# Patient Record
Sex: Female | Born: 1960 | Race: White | Hispanic: No | Smoking: Never smoker
Health system: Southern US, Community
[De-identification: ages and names within clinical notes are randomized; demographics above are authoritative.]

## PROBLEM LIST (undated history)

## (undated) DIAGNOSIS — N819 Female genital prolapse, unspecified: Secondary | ICD-10-CM

## (undated) DIAGNOSIS — F419 Anxiety disorder, unspecified: Secondary | ICD-10-CM

## (undated) HISTORY — PX: OTHER SURGICAL HISTORY: SHX169

## (undated) HISTORY — DX: Anxiety disorder, unspecified: F41.9

## (undated) HISTORY — DX: Female genital prolapse, unspecified: N81.9

---

## 1993-03-22 HISTORY — PX: BREAST LUMPECTOMY: SHX2

## 2014-12-30 HISTORY — PX: HYSTEROSCOPY: SHX211

## 2017-12-14 DIAGNOSIS — G43909 Migraine, unspecified, not intractable, without status migrainosus: Secondary | ICD-10-CM | POA: Insufficient documentation

## 2019-06-19 LAB — HM PAP SMEAR

## 2019-07-10 ENCOUNTER — Ambulatory Visit: Payer: Self-pay

## 2019-07-10 ENCOUNTER — Other Ambulatory Visit: Payer: Self-pay

## 2019-07-10 ENCOUNTER — Other Ambulatory Visit: Payer: Self-pay | Admitting: Family Medicine

## 2019-07-10 DIAGNOSIS — Z0000 Encounter for general adult medical examination without abnormal findings: Secondary | ICD-10-CM

## 2020-06-10 LAB — HM PAP SMEAR

## 2020-11-24 IMAGING — DX DG CHEST 1V
1 series · 1 of 1 positions shown · non-contrast
Comparison: None.

CLINICAL DATA: Physical exam.

EXAM:
CHEST  1 VIEW

[chest pa]
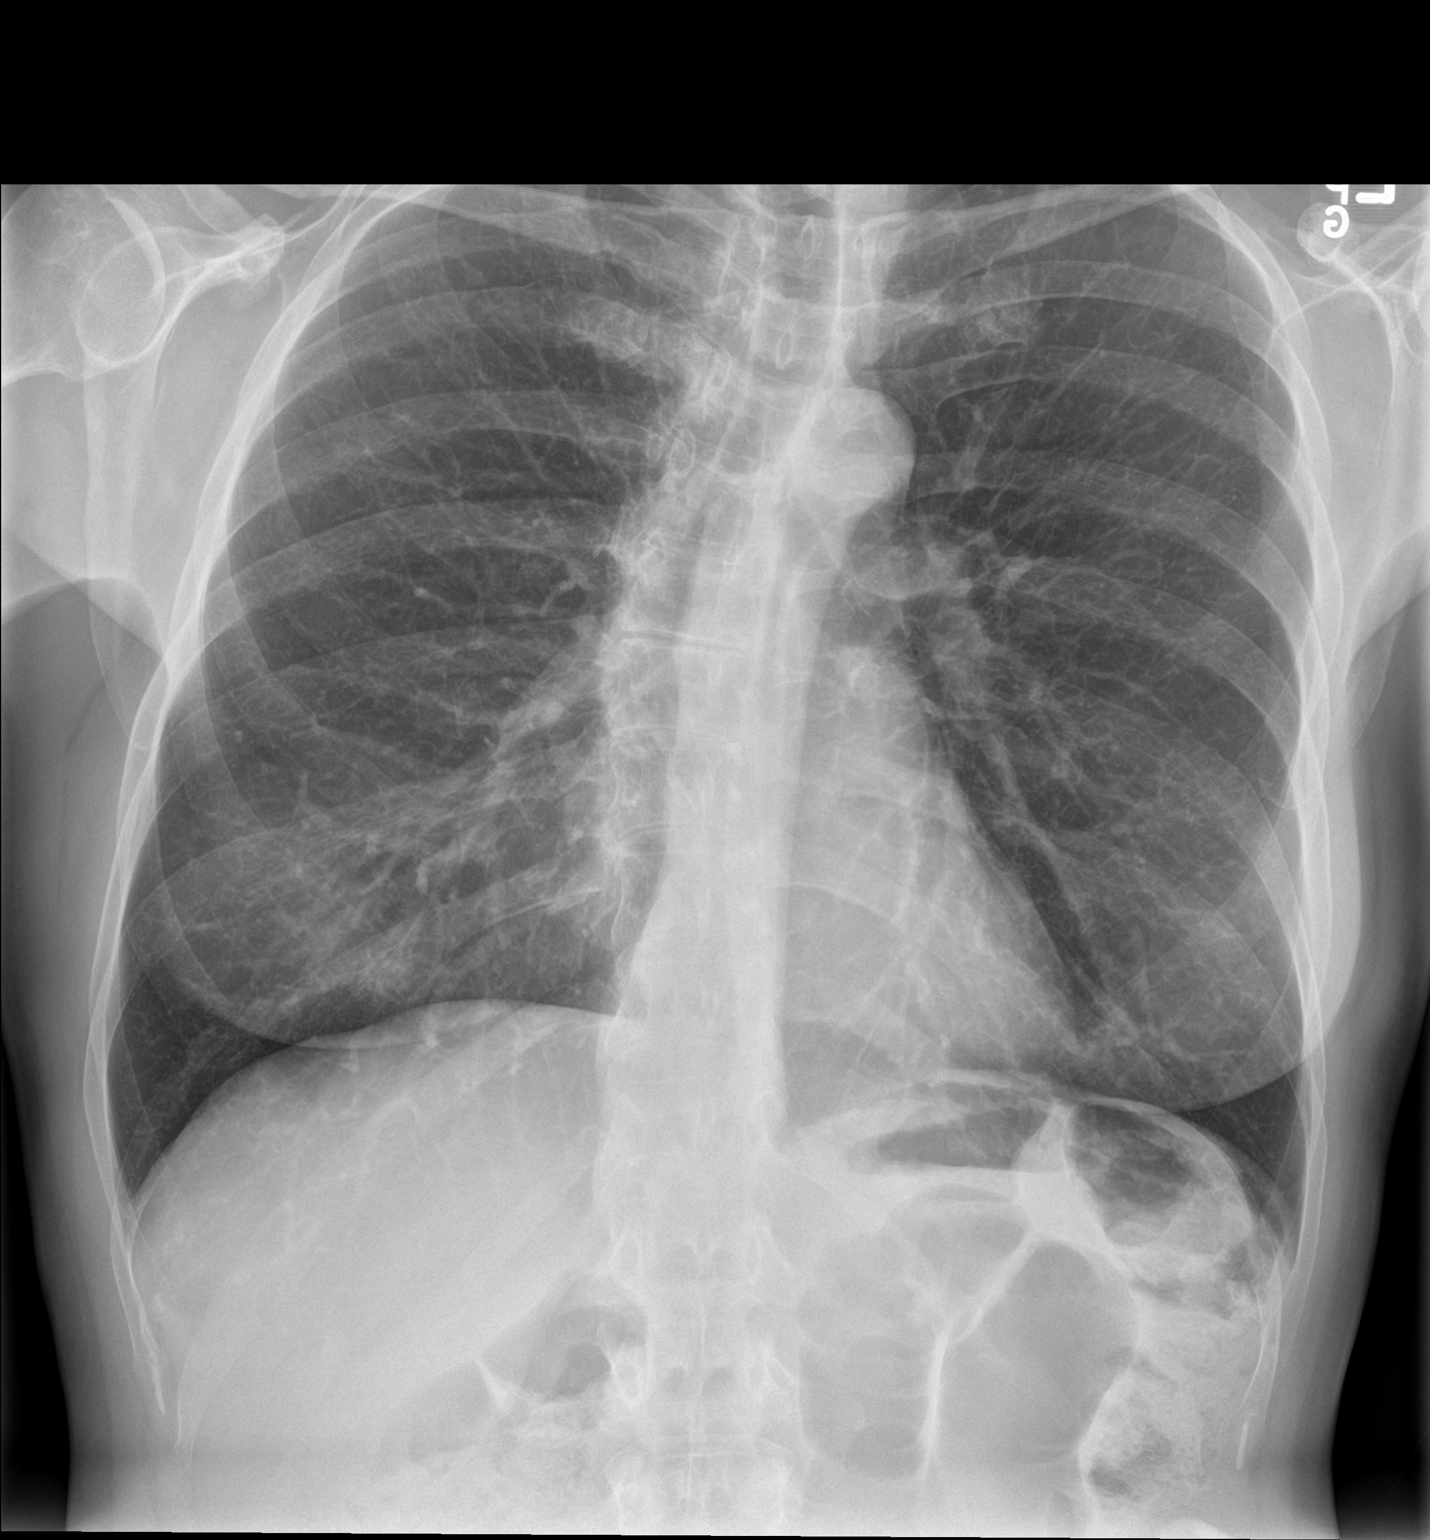

[1 of 1 positions shown; findings below may reference images not displayed]

FINDINGS: The heart size and mediastinal contours are within normal limits.
Both lungs are clear. The visualized skeletal structures are
unremarkable.
IMPRESSION: No active disease.

## 2022-06-02 NOTE — Progress Notes (Signed)
62 y.o.  G1P1 divorced Caucasian female here for annual exam.  Pt has prolapsed uterus and rectocele, for the last 4 - 5 years.   Patient referred by another patient of mine.   States that her prolapse is currently manageable.  Once in a while has accidental leakage of urine.  May have difficulty getting to bathroom on time due to urgency.  No loss of control of urine with exercise, cough, or laugh.   Voids every couple of hours.  Up to void once or twice a night.   No constipation, splinting or accidental leakage of stool.  Notes progression of prolapse during the day.   No problems with sexual functioning.   Declines STD screening.   Sporadic hot flashes.   Takes Effexor to balance her out.  Using for 20 years.   Living in [place].  Divorced twice.  Is working for [employer]. Son is in the [employer].   PCP:  Vikki Ports, MD  No LMP recorded. Patient is postmenopausal.      LMP 4 - 5 years ago.  IUD removed. No HRT.      Sexually active: Yes.    The current method of family planning is post menopausal status.    Exercising: Yes.     5x a week aerobic and calisthenics  Smoker:  no  Health Maintenance: Pap:  05/2021 normal.  Had HPV negative in 2022.  History of abnormal Pap:  yes, 1992, repeat pap and everything was normal MMG:  01/2022 WNL - BI-RADS2, [facility].  Colonoscopy:  done 2014, scheduled July 2024 through [facility] in [place].  BMD:   n/a  Result  n/a TDaP:  unsure, PCP Gardasil:   no Screening Labs:  PCP   reports that she has never smoked. She has never used smokeless tobacco. She reports that she does not drink alcohol and does not use drugs.  Past Medical History:  Diagnosis Date   Anxiety     Past Surgical History:  Procedure Laterality Date   BREAST LUMPECTOMY Left 1995   benign    Current Outpatient Medications  Medication Sig Dispense Refill   Calcium Carb-Cholecalciferol 600-5 MG-MCG TABS Take 1 tablet by mouth daily.      cetirizine (ZYRTEC) 10 MG tablet Take by mouth.     Cod Liver Oil 1000 MG CAPS      cycloSPORINE (RESTASIS) 0.05 % ophthalmic emulsion SMARTSIG:In Eye(s)     fluticasone (FLONASE) 50 MCG/ACT nasal spray      ketoconazole (NIZORAL) 2 % cream Apply topically.     Multiple Vitamin (MULTIVITAMIN) capsule Take 1 capsule by mouth daily.     Oral Electrolytes (EMERGEN-C ELECTRO MIX) PACK      SUMAtriptan (IMITREX) 100 MG tablet Take 100 mg by mouth as directed.     venlafaxine XR (EFFEXOR-XR) 150 MG 24 hr capsule Take 150 mg by mouth daily.     No current facility-administered medications for this visit.    Family History  Problem Relation Age of Onset   Kidney failure Father        middle stages   Heart Problems Father     Review of Systems  All other systems reviewed and are negative.   Exam:   BP 126/86 (BP Location: Right Arm, Patient Position: Sitting, Cuff Size: Normal)   Pulse 83   Ht 5' 1.5" (1.562 m)   Wt 123 lb (55.8 kg)   SpO2 97%   BMI 22.86 kg/m  General appearance: alert, cooperative and appears stated age Head: normocephalic, without obvious abnormality, atraumatic Neck: no adenopathy, supple, symmetrical, trachea midline and thyroid normal to inspection and palpation Lungs: clear to auscultation bilaterally Breasts: normal appearance, no masses or tenderness, No nipple retraction or dimpling, No nipple discharge or bleeding, No axillary adenopathy Heart: regular rate and rhythm Abdomen: soft, non-tender; no masses, no organomegaly Extremities: extremities normal, atraumatic, no cyanosis or edema Skin: skin color, texture, turgor normal. No rashes or lesions Lymph nodes: cervical, supraclavicular, and axillary nodes normal. Neurologic: grossly normal  Pelvic: External genitalia:  left inferior vulvar 1 cm skin tag.  Seen by dermatology and told it is OK.              No abnormal inguinal nodes palpated.              Urethra:  normal appearing urethra with no  masses, tenderness or lesions              Bartholins and Skenes: normal                 Vagina: normal appearing vagina with normal color and discharge, no lesions              Cervix: no lesions              Pap taken: no Bimanual Exam:  Uterus:  normal size, contour, position, consistency, mobility, non-tender.  Second degree cystocele and rectocele.  Almost second degree uterine prolapse.              Adnexa: no mass, fullness, tenderness              Rectal exam: yes.  Confirms.              Anus:  normal sphincter tone, no lesions  Chaperone was present for exam:  Raquel Sarna.   Assessment:   Well woman visit with gynecologic exam. Incomplete uterovaginal prolapse.   Plan: Mammogram screening discussed. Self breast awareness reviewed. Pap and HR HPV not indicated.  Guidelines for Calcium, Vitamin D, regular exercise program including cardiovascular and weight bearing exercise. We discussed pelvic organ prolapse and treatment options of pessary fitting, pelvic floor therapy and potential surgical reconstruction which could be a vaginal or laparoscopic robotic approach.  I would consider a laparoscopic robotic hysterectomy/bilateral salpingo-oophorectomy, and sacralcolpopexy and appropriate vaginal prolapse repair +/- anti-incontinence procedure.  Follow up annually and prn.   After visit summary provided.   15 min  total time was spent for this patient encounter, including preparation, face-to-face counseling with the patient, coordination of care, and documentation of the encounter in addition to doing annual examination.

## 2022-06-15 ENCOUNTER — Ambulatory Visit: Payer: Managed Care, Other (non HMO) | Admitting: Obstetrics and Gynecology

## 2022-06-15 ENCOUNTER — Encounter: Payer: Self-pay | Admitting: Obstetrics and Gynecology

## 2022-06-15 VITALS — BP 126/86 | HR 83 | Ht 61.5 in | Wt 123.0 lb

## 2022-06-15 DIAGNOSIS — N812 Incomplete uterovaginal prolapse: Secondary | ICD-10-CM

## 2022-06-15 DIAGNOSIS — Z01419 Encounter for gynecological examination (general) (routine) without abnormal findings: Secondary | ICD-10-CM

## 2022-06-15 NOTE — Patient Instructions (Signed)
EXERCISE AND DIET:  We recommended that you start or continue a regular exercise program for good health. Regular exercise means any activity that makes your heart beat faster and makes you sweat.  We recommend exercising at least 30 minutes per day at least 3 days a week, preferably 4 or 5.  We also recommend a diet low in fat and sugar.  Inactivity, poor dietary choices and obesity can cause diabetes, heart attack, stroke, and kidney damage, among others.    ALCOHOL AND SMOKING:  Women should limit their alcohol intake to no more than 7 drinks/beers/glasses of wine (combined, not each!) per week. Moderation of alcohol intake to this level decreases your risk of breast cancer and liver damage. And of course, no recreational drugs are part of a healthy lifestyle.  And absolutely no smoking or even second hand smoke. Most people know smoking can cause heart and lung diseases, but did you know it also contributes to weakening of your bones? Aging of your skin?  Yellowing of your teeth and nails?  CALCIUM AND VITAMIN D:  Adequate intake of calcium and Vitamin D are recommended.  The recommendations for exact amounts of these supplements seem to change often, but generally speaking 600 mg of calcium (either carbonate or citrate) and 800 units of Vitamin D per day seems prudent. Certain women may benefit from higher intake of Vitamin D.  If you are among these women, your doctor will have told you during your visit.    PAP SMEARS:  Pap smears, to check for cervical cancer or precancers,  have traditionally been done yearly, although recent scientific advances have shown that most women can have pap smears less often.  However, every woman still should have a physical exam from her gynecologist every year. It will include a breast check, inspection of the vulva and vagina to check for abnormal growths or skin changes, a visual exam of the cervix, and then an exam to evaluate the size and shape of the uterus and  ovaries.  And after 61 years of age, a rectal exam is indicated to check for rectal cancers. We will also provide age appropriate advice regarding health maintenance, like when you should have certain vaccines, screening for sexually transmitted diseases, bone density testing, colonoscopy, mammograms, etc.   MAMMOGRAMS:  All women over 40 years old should have a yearly mammogram. Many facilities now offer a "3D" mammogram, which may cost around $50 extra out of pocket. If possible,  we recommend you accept the option to have the 3D mammogram performed.  It both reduces the number of women who will be called back for extra views which then turn out to be normal, and it is better than the routine mammogram at detecting truly abnormal areas.    COLONOSCOPY:  Colonoscopy to screen for colon cancer is recommended for all women at age 50.  We know, you hate the idea of the prep.  We agree, BUT, having colon cancer and not knowing it is worse!!  Colon cancer so often starts as a polyp that can be seen and removed at colonscopy, which can quite literally save your life!  And if your first colonoscopy is normal and you have no family history of colon cancer, most women don't have to have it again for 10 years.  Once every ten years, you can do something that may end up saving your life, right?  We will be happy to help you get it scheduled when you are ready.    Be sure to check your insurance coverage so you understand how much it will cost.  It may be covered as a preventative service at no cost, but you should check your particular policy.    Pelvic Organ Prolapse Pelvic organ prolapse is a condition in women that involves the stretching, bulging, or dropping of pelvic organs into an abnormal position, past the opening of the vagina. It happens when the muscles and tissues that surround and support pelvic structures become weak or stretched. Pelvic organ prolapse can involve the: Vagina (vaginal prolapse). Uterus  (uterine prolapse). Bladder (cystocele). Rectum (rectocele). Intestines (enterocele). When organs other than the vagina are involved, they often bulge into the vagina or protrude from the vagina, depending on how severe the prolapse is. What are the causes? This condition may be caused by: Pregnancy, labor, and childbirth. Past pelvic surgery. Lower levels of the hormone estrogen due to menopause. Consistently lifting more than 50 lb (23 kg). Obesity. Long-term difficulty passing stool (chronic constipation). Long-term, or chronic, cough. Fluid buildup in the abdomen due to certain conditions. What are the signs or symptoms? Symptoms of this condition include: Leaking a little urine (loss of bladder control) when you cough, sneeze, strain, and exercise (stress incontinence). This may be worse immediately after childbirth. It may gradually improve over time. Feeling pressure in your pelvis or vagina. This pressure may increase when you cough or when you are passing stool. A bulge that protrudes from the opening of your vagina. Difficulty passing urine or stool. Pain in your lower back. Pain or discomfort during sex, or decreased interest in sex. Repeated bladder infections (urinary tract infections). Difficulty inserting a tampon. In some people, this condition causes no symptoms. How is this diagnosed? This condition may be diagnosed based on a vaginal and rectal exam. During the exam, you may be asked to cough and strain while you are lying down, sitting, and standing up. Your health care provider will determine if other tests are required, such as bladder function tests. How is this treated? Treatment for this condition may depend on your symptoms. Treatment may include: Lifestyle changes, such as drinking plenty of fluids and eating foods that are high in fiber. Emptying your bladder at scheduled times (bladder training therapy). This can help reduce or avoid urinary  incontinence. Estrogen. This may help mild prolapse by increasing the strength and tone of pelvic floor muscles. Kegel exercises. These may help mild cases of prolapse by strengthening and tightening the muscles of the pelvic floor. A soft, flexible device that helps support the vaginal walls and keep pelvic organs in place (pessary). This is inserted into your vagina by your health care provider. Surgery. This is often the only form of treatment for severe prolapse. Follow these instructions at home: Eating and drinking Avoid drinking beverages that contain caffeine or alcohol. Increase your intake of high-fiber foods to decrease constipation and straining during bowel movements. Activity Lose weight if recommended by your health care provider. Avoid heavy lifting and straining with exercise and work. Do not hold your breath when you perform mild to moderate lifting and exercise activities. Limit your activities as directed by your health care provider. Do Kegel exercises as directed by your health care provider. To do this: Squeeze your pelvic floor muscles tight. You should feel a tight lift in your rectal area and a tightness in your vaginal area. Keep your stomach, buttocks, and legs relaxed. Hold the muscles tight for up to 10 seconds. Then relax your muscles.   Repeat this exercise 50 times a day, or as much as told by your health care provider. Continue to do this exercise for at least 4-6 weeks, or for as long as told by your health care provider. General instructions Take over-the-counter and prescription medicines only as told by your health care provider. Wear a sanitary pad or adult diapers if you have urinary incontinence. If you have a pessary, take care of it as told by your health care provider. Keep all follow-up visits. This is important. Contact a health care provider if you: Have symptoms that interfere with your daily activities or sex life. Need medicine to help with the  discomfort. Notice bleeding from your vagina that is not related to your menstrual period. Have a fever. Have pain or bleeding when you urinate. Have bleeding when you pass stool. Pass urine when you have sex. Have chronic constipation. Have a pessary that falls out. Have a foul-smelling vaginal discharge. Have an unusual, low pain in your abdomen. Get help right away if you: Cannot pass urine. Summary Pelvic organ prolapse is the stretching, bulging, or dropping of pelvic organs into an abnormal position. It happens when the muscles and tissues that surround and support pelvic structures become weak or stretched. When organs other than the vagina are involved, they often bulge into the vagina or protrude from it, depending on how severe the prolapse is. In most cases, this condition needs to be treated only if it produces symptoms. Treatment may include lifestyle changes, estrogen, Kegel exercises, pessary insertion, or surgery. Avoid heavy lifting and straining with exercise and work. Do not hold your breath when you perform mild to moderate lifting and exercise activities. Limit your activities as directed by your health care provider. This information is not intended to replace advice given to you by your health care provider. Make sure you discuss any questions you have with your health care provider. Document Revised: 09/03/2019 Document Reviewed: 09/03/2019 Elsevier Patient Education  2023 [employer] Inc.  

## 2022-06-16 ENCOUNTER — Encounter: Payer: Self-pay | Admitting: Obstetrics and Gynecology

## 2022-06-23 NOTE — Telephone Encounter (Signed)
Please enter tetanus vaccine date in health maintenance for this patient.   Thank you.

## 2022-06-24 LAB — HM PAP SMEAR

## 2022-06-30 ENCOUNTER — Encounter: Payer: Self-pay | Admitting: Obstetrics and Gynecology

## 2022-07-06 ENCOUNTER — Telehealth: Payer: Self-pay | Admitting: Obstetrics and Gynecology

## 2022-07-06 ENCOUNTER — Encounter: Payer: Self-pay | Admitting: Obstetrics and Gynecology

## 2022-07-06 NOTE — Telephone Encounter (Signed)
Spoke with patient and informed her. °

## 2022-07-06 NOTE — Telephone Encounter (Signed)
Please let patient know that I received her medical records from [facility].

## 2022-07-06 NOTE — Telephone Encounter (Signed)
Encounter reviewed and closed.  

## 2023-07-21 ENCOUNTER — Telehealth: Payer: Self-pay

## 2023-07-21 ENCOUNTER — Ambulatory Visit: Admitting: Obstetrics and Gynecology

## 2023-07-21 ENCOUNTER — Encounter: Payer: Self-pay | Admitting: Obstetrics and Gynecology

## 2023-07-21 VITALS — BP 112/78 | HR 100

## 2023-07-21 DIAGNOSIS — N812 Incomplete uterovaginal prolapse: Secondary | ICD-10-CM

## 2023-07-21 DIAGNOSIS — Z4689 Encounter for fitting and adjustment of other specified devices: Secondary | ICD-10-CM | POA: Diagnosis not present

## 2023-07-21 NOTE — Progress Notes (Unsigned)
 GYNECOLOGY  VISIT   HPI: 63 y.o.   Divorced  Caucasian non-Hispanic female   G1P0 with No LMP recorded. Patient is postmenopausal.   here for: desires pessary fitting.  Wants relief of the prolapse.  Hx second degree cystocele and rectocele and almost second degree uterine prolapse.   No stress incontinence now or in the past.   Some urinary urgency.  Some need to void more often.   Used Nuvaring in the past.   GYNECOLOGIC HISTORY: No LMP recorded. Patient is postmenopausal. Contraception: PM Menopausal hormone therapy: none Last 2 paps: 2022-WNL, 06/2022-WNL History of abnormal Pap or positive HPV: yes, 1992, repeat pap and everything was normal. Mammogram: 02/07/2023-WNL, [facility]        OB History     Gravida  1   Para      Term      Preterm      AB      Living  1      SAB      IAB      Ectopic      Multiple      Live Births  1              Patient Active Problem List   Diagnosis Date Noted   Migraine without status migrainosus, not intractable 12/14/2017    Past Medical History:  Diagnosis Date   Anxiety    Prolapse of female pelvic organs     Past Surgical History:  Procedure Laterality Date   Bartholin gland surgery     BREAST LUMPECTOMY Left 1995   benign   HYSTEROSCOPY  12/30/2014    Current Outpatient Medications  Medication Sig Dispense Refill   Calcium Carb-Cholecalciferol 600-5 MG-MCG TABS Take 1 tablet by mouth daily.     cetirizine (ZYRTEC) 10 MG tablet Take by mouth.     Cod Liver Oil 1000 MG CAPS      cycloSPORINE (RESTASIS) 0.05 % ophthalmic emulsion SMARTSIG:In Eye(s)     fluticasone (FLONASE) 50 MCG/ACT nasal spray      fluticasone-salmeterol (ADVAIR) 100-50 MCG/ACT AEPB Inhale 1 puff into the lungs.     ketoconazole (NIZORAL) 2 % cream Apply topically.     LUTEIN PO Take by mouth.     Multiple Vitamin (MULTIVITAMIN) capsule Take 1 capsule by mouth daily.     Oral Electrolytes (EMERGEN-C ELECTRO MIX) PACK       SUMAtriptan (IMITREX) 100 MG tablet Take 100 mg by mouth as directed.     triamcinolone cream (KENALOG) 0.1 % Apply 1 Application topically.     venlafaxine XR (EFFEXOR-XR) 150 MG 24 hr capsule Take 150 mg by mouth daily.     No current facility-administered medications for this visit.     ALLERGIES: Patient has no allergy information on record.  Family History  Problem Relation Age of Onset   Kidney failure Father        middle stages   Heart Problems Father     Social History   Socioeconomic History   Marital status: Divorced    Spouse name: Not on file   Number of children: Not on file   Years of education: Not on file   Highest education level: Not on file  Occupational History   Not on file  Tobacco Use   Smoking status: Never   Smokeless tobacco: Never  Substance and Sexual Activity   Alcohol use: Never   Drug use: Never   Sexual activity: Yes  Birth control/protection: Post-menopausal  Other Topics Concern   Not on file  Social History Narrative   Not on file   Social Drivers of Health   Financial Resource Strain: Low Risk  (01/07/2023)   Received from [facility]   Overall Financial Resource Strain (CARDIA)    Difficulty of Paying Living Expenses: Not hard at all  Food Insecurity: No Food Insecurity (01/07/2023)   Received from [facility]   Hunger Vital Sign    Worried About Running Out of Food in the Last Year: Never true    Ran Out of Food in the Last Year: Never true  Transportation Needs: No Transportation Needs (01/07/2023)   Received from [facility] - Transportation    Lack of Transportation (Medical): No    Lack of Transportation (Non-Medical): No  Physical Activity: Insufficiently Active (01/07/2023)   Received from [facility]   Exercise Vital Sign    Days of Exercise per Week: 5 days    Minutes of Exercise per Session: 20 min  Stress: Stress Concern Present (01/07/2023)   Received from [facility] - Occupational Stress Questionnaire    Feeling of Stress : To some extent  Social Connections: Moderately Integrated (01/07/2023)   Received from [facility]   Social Network    How would you rate your social network (family, work, friends)?: Adequate participation with social networks  Intimate Partner Violence: Not At Risk (01/07/2023)   Received from [facility]   HITS    Over the last 12 months how often did your partner physically hurt you?: Never    Over the last 12 months how often did your partner insult you or talk down to you?: Never    Over the last 12 months how often did your partner threaten you with physical harm?: Never    Over the last 12 months how often did your partner scream or curse at you?: Never    Review of Systems  All other systems reviewed and are negative.   PHYSICAL EXAMINATION:   BP 112/78   Pulse 100   SpO2 98%     General appearance: alert, cooperative and appears stated age   Pelvic: External genitalia:  no lesions              Urethra:  normal appearing urethra with no masses, tenderness or lesions              Bartholins and Skenes: normal                 Vagina: normal appearing vagina with normal color and discharge, no lesions.  Third degree cystocele, second degree uterine prolapse, second degree rectocele.               Cervix: no lesions                Bimanual Exam:  Uterus:  normal size, contour, position, consistency, mobility, non-tender              Adnexa: no mass, fullness, tenderness   #4 ring with support fitted.  Comfortable.  Can feel it slightly.  Voided ok.  Able to maintain the pessary.  It was removed.  #5 ring with support fitted.  Comfortable.  Does not feel it.  Voided ok and a better stream.  Able to maintain pessary.  It was removed.  Chaperone was present for exam:  Alesia Husky, CMA  ASSESSMENT:  Incomplete  uterovaginal prolapse.  Progressive.   Pessary fitting completed.    PLAN:  #5 ring with support will be ordered.   Patient will return after the pessary is available.

## 2023-07-21 NOTE — Patient Instructions (Signed)
 PESSARY CARE  You may take the pessary out once or twice a week at bedtime and leave it out overnight.   Clean the pessary with soap and water only.   If you develop green foul smelling discharge, leave the pessary out for 2 - 3 days.   Use KY jelly or any other personal lubricant that is water based to help with placement and removal.   Wear a sanitary pad until you are sure you understand if you have urinary incontinence with the pessary in place.   Wear biking type shorts with crotch support for exercise until you know that the pessary will not come out easily with maneuvers causing increases in abdominal pressure.  The pessary needs to be removed for sexual activity.   Look before you flush the toilet to be sure the pessary has not come out with straining on the toilet!  Please call for any pain or vaginal bleeding.    How to Use a Vaginal Pessary  A vaginal pessary is a removable device that is placed into your vagina to support pelvic organs that droop. These organs include your uterus, bladder, and rectum. When your pelvic organs drop down into your vagina, it causes a condition called pelvic organ prolapse (POP). A pessary may be an alternative to surgery for women with POP. It may help women who leak urine when they strain or exercise (stress incontinence). This is a symptom of POP. A vaginal pessary may also be a temporary treatment for stress incontinence during pregnancy. There are several types of pessaries. All types are usually made of silicone. You can insert and remove some on your own. Other types must be inserted and removed by your health care provider at office visits. The reason you are using a pessary and the severity of your condition will determine which one is best for you. It is also important to find the right size. A pessary that is too small may fall out. A pessary that is too large may cause pain or discomfort. Your health care provider will do a physical exam  to find the correct size and fit for your pessary. It may take several appointments to find the best fit for you. If you can be fit with the type of pessary that you can insert, remove, and clean yourself, your health care provider will teach you how to use your pessary at home. You may have checkups every few months. If you have the type of pessary that needs to be inserted and removed by your health care provider, you will have appointments every few months to have the pessary removed, cleaned, and replaced. What are the risks? When properly fitted and cared for, risks of using a vaginal pessary can be small. However, there can be problems that may include: Vaginal discharge. Vaginal bleeding. A bad smell coming from your vagina. Scraping of the skin inside your vagina. How to use your pessary Follow your health care provider's instructions for using a pessary. These instructions may vary, depending on the type of pessary you have. To insert a pessary: Wash your hands with soap and water for at least 20 seconds. Squeeze or fold the pessary in half and lubricate the tip with a water-based lubricant. Insert the pessary into your vagina. It will unfold and provide support. To remove the pessary, gently tug it out of your vagina. You can remove the pessary every night or after several days. You can also remove it to have sex. How  to care for your pessary If you have a pessary that you can remove: Clean your pessary with soap and water. Rinse well. Dry it completely before inserting it back into your vagina. Follow these instructions at home: Take over-the-counter and prescription medicines only as told by your health care provider. Your health care provider may prescribe an estrogen cream to moisten your vagina. Keep all follow-up visits. This is important. Contact a health care provider if: You feel any pain or discomfort when your pessary is in place. You continue to have stress  incontinence. You have trouble keeping your pessary from falling out. You have an unusual vaginal discharge that is blood-tinged or smells bad. Summary A vaginal pessary is a removable device that is placed into your vagina to support pelvic organs that droop. This condition is called pelvic organ prolapse (POP). There are several types of pessaries. Some you can insert and remove on your own. Others must be inserted and removed by your health care provider. The best type for you depends on the reason you are using a pessary and the severity of your condition. It is also important to find the right size. If you can use the type that you insert and remove on your own, your health care provider will teach you how to use it and schedule checkups every few months. If you have the type that needs to be inserted and removed by your health care provider, you will have regular appointments to have your pessary removed, cleaned, and replaced. This information is not intended to replace advice given to you by your health care provider. Make sure you discuss any questions you have with your health care provider. Document Revised: 09/06/2019 Document Reviewed: 09/06/2019 Elsevier Patient Education  2024 [employer].

## 2023-07-21 NOTE — Telephone Encounter (Signed)
 Per BS:  Can we please order a #5 pessary ring with support for this pt?  Thanks.

## 2023-07-21 NOTE — Telephone Encounter (Signed)
Pessary ordered

## 2023-07-25 NOTE — Telephone Encounter (Signed)
 Pessary in office.   Spoke with patient, scheduled for AEX 07/27/23.   Pessary to Jada.   Routing FYI  Dr. Colvin Dec and Jada, CMA  Encounter closed.

## 2023-07-26 NOTE — Progress Notes (Unsigned)
 63 y.o. G1P0 Divorced Caucasian female here for annual exam.    PCP: Pearl Bottcher, MD   No LMP recorded. Patient is postmenopausal.           Sexually active: Yes.    The current method of family planning is post menopausal status.    Menopausal hormone therapy:  n/a Exercising: {yes no:314532}  {types:19826} Smoker:  no  OB History  Gravida Para Term Preterm AB Living  1     1  SAB IAB Ectopic Multiple Live Births      1    # Outcome Date GA Lbr Len/2nd Weight Sex Type Anes PTL Lv  1 Gravida              HEALTH MAINTENANCE: Last 2 paps:  06/24/22 neg HPV neg, 06/10/20 neg  History of abnormal Pap or positive HPV:  yes, 1992, repeat pap everything was normal Mammogram:   02/07/23 [facility] Cat 2 benign (care everywhere) Colonoscopy:  10/21/22 Bone Density:  n/a  Result  n/a   Immunization History  Administered Date(s) Administered   DTaP 08/20/2005   Influenza Inj Mdck Quad Pf 12/06/2014, 12/08/2015, 12/10/2016, 12/15/2018, 12/21/2019, 01/05/2022   Influenza, Mdck, Trivalent,PF 6+ MOS(egg free) 01/10/2023   Influenza, Quadrivalent, Recombinant, Inj, Pf 12/04/2020   Influenza,inj,quad, With Preservative 12/22/2019   PFIZER Comirnaty(Gray Top)Covid-19 Tri-Sucrose Vaccine 02/15/2020, 10/03/2020   PFIZER(Purple Top)SARS-COV-2 Vaccination 05/23/2019, 06/20/2019   PPD Test 06/13/2017   Tdap 12/08/2015   Zoster Recombinant(Shingrix) 12/21/2019, 04/04/2020      reports that she has never smoked. She has never used smokeless tobacco. She reports that she does not drink alcohol and does not use drugs.  Past Medical History:  Diagnosis Date   Anxiety    Prolapse of female pelvic organs     Past Surgical History:  Procedure Laterality Date   Bartholin gland surgery     BREAST LUMPECTOMY Left 1995   benign   HYSTEROSCOPY  12/30/2014    Current Outpatient Medications  Medication Sig Dispense Refill   Calcium Carb-Cholecalciferol 600-5 MG-MCG TABS Take 1 tablet by mouth  daily.     cetirizine (ZYRTEC) 10 MG tablet Take by mouth.     Cod Liver Oil 1000 MG CAPS      cycloSPORINE (RESTASIS) 0.05 % ophthalmic emulsion SMARTSIG:In Eye(s)     fluticasone (FLONASE) 50 MCG/ACT nasal spray      fluticasone-salmeterol (ADVAIR) 100-50 MCG/ACT AEPB Inhale 1 puff into the lungs.     ketoconazole (NIZORAL) 2 % cream Apply topically.     LUTEIN PO Take by mouth.     Multiple Vitamin (MULTIVITAMIN) capsule Take 1 capsule by mouth daily.     Oral Electrolytes (EMERGEN-C ELECTRO MIX) PACK      SUMAtriptan (IMITREX) 100 MG tablet Take 100 mg by mouth as directed.     triamcinolone cream (KENALOG) 0.1 % Apply 1 Application topically.     venlafaxine XR (EFFEXOR-XR) 150 MG 24 hr capsule Take 150 mg by mouth daily.     No current facility-administered medications for this visit.    ALLERGIES: Patient has no allergy information on record.  Family History  Problem Relation Age of Onset   Kidney failure Father        middle stages   Heart Problems Father     Review of Systems  PHYSICAL EXAM:  There were no vitals taken for this visit.    General appearance: alert, cooperative and appears stated age Head: normocephalic, without obvious  abnormality, atraumatic Neck: no adenopathy, supple, symmetrical, trachea midline and thyroid normal to inspection and palpation Lungs: clear to auscultation bilaterally Breasts: normal appearance, no masses or tenderness, No nipple retraction or dimpling, No nipple discharge or bleeding, No axillary adenopathy Heart: regular rate and rhythm Abdomen: soft, non-tender; no masses, no organomegaly Extremities: extremities normal, atraumatic, no cyanosis or edema Skin: skin color, texture, turgor normal. No rashes or lesions Lymph nodes: cervical, supraclavicular, and axillary nodes normal. Neurologic: grossly normal  Pelvic: External genitalia:  no lesions              No abnormal inguinal nodes palpated.              Urethra:  normal  appearing urethra with no masses, tenderness or lesions              Bartholins and Skenes: normal                 Vagina: normal appearing vagina with normal color and discharge, no lesions              Cervix: no lesions              Pap taken: {yes no:314532} Bimanual Exam:  Uterus:  normal size, contour, position, consistency, mobility, non-tender              Adnexa: no mass, fullness, tenderness              Rectal exam: {yes no:314532}.  Confirms.              Anus:  normal sphincter tone, no lesions  Chaperone was present for exam:  {BSCHAPERONE:31226::"Emily F, CMA"}  ASSESSMENT: Well woman visit with gynecologic exam.  PHQ-9: ***  ***  PLAN: Mammogram screening discussed. Self breast awareness reviewed. Pap and HRV collected:  {yes no:314532} Guidelines for Calcium, Vitamin D, regular exercise program including cardiovascular and weight bearing exercise. Medication refills:  *** {LABS (Optional):23779} Follow up:  ***    Additional counseling given.  {yes [id number]. ***  total time was spent for this patient encounter, including preparation, face-to-face counseling with the patient, coordination of care, and documentation of the encounter in addition to doing the well woman visit with gynecologic exam.

## 2023-07-27 ENCOUNTER — Ambulatory Visit (INDEPENDENT_AMBULATORY_CARE_PROVIDER_SITE_OTHER): Payer: Managed Care, Other (non HMO) | Admitting: Obstetrics and Gynecology

## 2023-07-27 ENCOUNTER — Encounter: Payer: Self-pay | Admitting: Obstetrics and Gynecology

## 2023-07-27 VITALS — BP 124/86 | HR 89 | Ht 61.25 in | Wt 124.0 lb

## 2023-07-27 DIAGNOSIS — N812 Incomplete uterovaginal prolapse: Secondary | ICD-10-CM | POA: Diagnosis not present

## 2023-07-27 DIAGNOSIS — Z01419 Encounter for gynecological examination (general) (routine) without abnormal findings: Secondary | ICD-10-CM

## 2023-07-27 DIAGNOSIS — Z1331 Encounter for screening for depression: Secondary | ICD-10-CM | POA: Diagnosis not present

## 2023-07-27 NOTE — Telephone Encounter (Signed)
 Spoke with patient. She put pessary in & it feels fine. I told pt to place her finger in her vagina & apply a little pressure to her pessary when having a bowel movement. Patient states if her pessary stays in that she will see you at her 1 week follow up appt. If any problems she will give us  a call.

## 2023-07-27 NOTE — Patient Instructions (Addendum)
 PESSARY CARE  You may take the pessary out once or twice a week at bedtime and leave it out overnight.   Clean the pessary with soap and water only.   If you develop green foul smelling discharge, leave the pessary out for 2 - 3 days.   Use KY jelly or any other personal lubricant that is water based to help with placement and removal.   Wear a sanitary pad until you are sure you understand if you have urinary incontinence with the pessary in place.   Wear biking type shorts with crotch support for exercise until you know that the pessary will not come out easily with maneuvers causing increases in abdominal pressure.  The pessary needs to be removed for sexual activity.   Look before you flush the toilet to be sure the pessary has not come out with straining on the toilet!  Please call for any pain or vaginal bleeding.    EXERCISE AND DIET:  We recommended that you start or continue a regular exercise program for good health. Regular exercise means any activity that makes your heart beat faster and makes you sweat.  We recommend exercising at least 30 minutes per day at least 3 days a week, preferably 4 or 5.  We also recommend a diet low in fat and sugar.  Inactivity, poor dietary choices and obesity can cause diabetes, heart attack, stroke, and kidney damage, among others.    ALCOHOL AND SMOKING:  Women should limit their alcohol intake to no more than 7 drinks/beers/glasses of wine (combined, not each!) per week. Moderation of alcohol intake to this level decreases your risk of breast cancer and liver damage. And of course, no recreational drugs are part of a healthy lifestyle.  And absolutely no smoking or even second hand smoke. Most people know smoking can cause heart and lung diseases, but did you know it also contributes to weakening of your bones? Aging of your skin?  Yellowing of your teeth and nails?  CALCIUM AND VITAMIN D:  Adequate intake of calcium and Vitamin D are  recommended.  The recommendations for exact amounts of these supplements seem to change often, but generally speaking 600 mg of calcium (either carbonate or citrate) and 800 units of Vitamin D per day seems prudent. Certain women may benefit from higher intake of Vitamin D.  If you are among these women, your doctor will have told you during your visit.    PAP SMEARS:  Pap smears, to check for cervical cancer or precancers,  have traditionally been done yearly, although recent scientific advances have shown that most women can have pap smears less often.  However, every woman still should have a physical exam from her gynecologist every year. It will include a breast check, inspection of the vulva and vagina to check for abnormal growths or skin changes, a visual exam of the cervix, and then an exam to evaluate the size and shape of the uterus and ovaries.  And after 62 years of age, a rectal exam is indicated to check for rectal cancers. We will also provide age appropriate advice regarding health maintenance, like when you should have certain vaccines, screening for sexually transmitted diseases, bone density testing, colonoscopy, mammograms, etc.   MAMMOGRAMS:  All women over 45 years old should have a yearly mammogram. Many facilities now offer a "3D" mammogram, which may cost around $50 extra out of pocket. If possible,  we recommend you accept the option to have the 3D  mammogram performed.  It both reduces the number of women who will be called back for extra views which then turn out to be normal, and it is better than the routine mammogram at detecting truly abnormal areas.    COLONOSCOPY:  Colonoscopy to screen for colon cancer is recommended for all women at age 66.  We know, you hate the idea of the prep.  We agree, BUT, having colon cancer and not knowing it is worse!!  Colon cancer so often starts as a polyp that can be seen and removed at colonscopy, which can quite literally save your life!  And  if your first colonoscopy is normal and you have no family history of colon cancer, most women don't have to have it again for 10 years.  Once every ten years, you can do something that may end up saving your life, right?  We will be happy to help you get it scheduled when you are ready.  Be sure to check your insurance coverage so you understand how much it will cost.  It may be covered as a preventative service at no cost, but you should check your particular policy.

## 2023-08-02 NOTE — Progress Notes (Unsigned)
 GYNECOLOGY  VISIT   HPI: 63 y.o.   Divorced  Caucasian female   G1P0 with No LMP recorded. Patient is postmenopausal.   here for: 1 week pessary follow up. Patient states it fell out on the first day but she put it back in herself and its been great ever since. No complaints.   Using a #5 ring with support pessary.   Loves the pessary.  States she is very pleased. Does not feel the pessary in place. No pain.  Normal urine stream now.   GYNECOLOGIC HISTORY: No LMP recorded. Patient is postmenopausal. Contraception:  PMP Menopausal hormone therapy:  n/a Last 2 paps:  06/24/22 neg HPV neg, 06/10/20 neg History of abnormal Pap or positive HPV:  yes 1992 Mammogram:  02/07/23 [facility] Cat 2 benign(care everywhere)        OB History     Gravida  1   Para      Term      Preterm      AB      Living  1      SAB      IAB      Ectopic      Multiple      Live Births  1              Patient Active Problem List   Diagnosis Date Noted   Migraine without status migrainosus, not intractable 12/14/2017    Past Medical History:  Diagnosis Date   Anxiety    Prolapse of female pelvic organs     Past Surgical History:  Procedure Laterality Date   Bartholin gland surgery     BREAST LUMPECTOMY Left 1995   benign   HYSTEROSCOPY  12/30/2014    Current Outpatient Medications  Medication Sig Dispense Refill   Calcium Carb-Cholecalciferol 600-5 MG-MCG TABS Take 1 tablet by mouth daily.     cetirizine (ZYRTEC) 10 MG tablet Take by mouth.     Cod Liver Oil 1000 MG CAPS      cycloSPORINE (RESTASIS) 0.05 % ophthalmic emulsion SMARTSIG:In Eye(s)     fluticasone (FLONASE) 50 MCG/ACT nasal spray      ketoconazole (NIZORAL) 2 % cream Apply topically.     Multiple Vitamin (MULTIVITAMIN) capsule Take 1 capsule by mouth daily.     Oral Electrolytes (EMERGEN-C ELECTRO MIX) PACK      SUMAtriptan (IMITREX) 100 MG tablet Take 100 mg by mouth as directed.     triamcinolone cream  (KENALOG) 0.1 % Apply 1 Application topically.     venlafaxine XR (EFFEXOR-XR) 150 MG 24 hr capsule Take 150 mg by mouth daily.     No current facility-administered medications for this visit.     ALLERGIES: Patient has no allergy information on record.  Family History  Problem Relation Age of Onset   Kidney failure Father        middle stages   Heart Problems Father     Social History   Socioeconomic History   Marital status: Divorced    Spouse name: Not on file   Number of children: Not on file   Years of education: Not on file   Highest education level: Not on file  Occupational History   Not on file  Tobacco Use   Smoking status: Never   Smokeless tobacco: Never  Substance and Sexual Activity   Alcohol use: Never   Drug use: Never   Sexual activity: Yes    Birth control/protection: Post-menopausal  Other  Topics Concern   Not on file  Social History Narrative   Not on file   Social Drivers of Health   Financial Resource Strain: Low Risk  (01/07/2023)   Received from [facility]   Overall Financial Resource Strain (CARDIA)    Difficulty of Paying Living Expenses: Not hard at all  Food Insecurity: No Food Insecurity (01/07/2023)   Received from [facility]   Hunger Vital Sign    Worried About Running Out of Food in the Last Year: Never true    Ran Out of Food in the Last Year: Never true  Transportation Needs: No Transportation Needs (01/07/2023)   Received from [facility] - Transportation    Lack of Transportation (Medical): No    Lack of Transportation (Non-Medical): No  Physical Activity: Insufficiently Active (01/07/2023)   Received from [facility]   Exercise Vital Sign    Days of Exercise per Week: 5 days    Minutes of Exercise per Session: 20 min  Stress: Stress Concern Present (01/07/2023)   Received from [facility] - Occupational Stress Questionnaire    Feeling of Stress : To some  extent  Social Connections: Moderately Integrated (01/07/2023)   Received from [facility]   Social Network    How would you rate your social network (family, work, friends)?: Adequate participation with social networks  Intimate Partner Violence: Not At Risk (01/07/2023)   Received from [facility]   HITS    Over the last 12 months how often did your partner physically hurt you?: Never    Over the last 12 months how often did your partner insult you or talk down to you?: Never    Over the last 12 months how often did your partner threaten you with physical harm?: Never    Over the last 12 months how often did your partner scream or curse at you?: Never    Review of Systems  All other systems reviewed and are negative.   PHYSICAL EXAMINATION:   BP 118/74 (BP Location: Left Arm, Patient Position: Sitting)   Pulse 94   SpO2 99%     General appearance: alert, cooperative and appears stated age   Pelvic: External genitalia:  no lesions              Urethra:  normal appearing urethra with no masses, tenderness or lesions              Bartholins and Skenes: normal                 Vagina: normal appearing vagina with normal color and discharge, no lesions              Cervix: no lesions                Bimanual Exam:  Uterus:  normal size, contour, position, consistency, mobility, non-tender              Adnexa: no mass, fullness, tenderness          Pessary removed, cleansed, and replaced.   Chaperone was present for exam:  Cottie Diss, CMA  ASSESSMENT:  Incomplete uterovaginal prolapse. Pessary management.  Doing well.   PLAN:  Continue pessary care.  Consider vaginal estrogen cream if has recurrent vaginal discharge or develops vaginal bleeding with pessary use.  Fu in 6 months and prn.   20 min  total time was spent for this patient encounter,  including preparation, face-to-face counseling with the patient, coordination of care, and documentation of the encounter.

## 2023-08-03 ENCOUNTER — Ambulatory Visit: Admitting: Obstetrics and Gynecology

## 2023-08-03 ENCOUNTER — Encounter: Payer: Self-pay | Admitting: Obstetrics and Gynecology

## 2023-08-03 VITALS — BP 118/74 | HR 94

## 2023-08-03 DIAGNOSIS — N812 Incomplete uterovaginal prolapse: Secondary | ICD-10-CM | POA: Diagnosis not present

## 2023-08-03 DIAGNOSIS — Z4689 Encounter for fitting and adjustment of other specified devices: Secondary | ICD-10-CM | POA: Diagnosis not present

## 2024-02-06 ENCOUNTER — Ambulatory Visit: Admitting: Obstetrics and Gynecology

## 2024-02-06 ENCOUNTER — Encounter: Payer: Self-pay | Admitting: Obstetrics and Gynecology

## 2024-02-06 VITALS — BP 126/84 | HR 96

## 2024-02-06 DIAGNOSIS — N760 Acute vaginitis: Secondary | ICD-10-CM

## 2024-02-06 DIAGNOSIS — Z4689 Encounter for fitting and adjustment of other specified devices: Secondary | ICD-10-CM

## 2024-02-06 DIAGNOSIS — N812 Incomplete uterovaginal prolapse: Secondary | ICD-10-CM

## 2024-02-06 DIAGNOSIS — T8369XA Infection and inflammatory reaction due to other prosthetic device, implant and graft in genital tract, initial encounter: Secondary | ICD-10-CM

## 2024-02-06 MED ORDER — ESTRADIOL 0.01 % VA CREA: TOPICAL_CREAM | VAGINAL | 1 refills | Status: AC

## 2024-02-06 NOTE — Progress Notes (Signed)
 GYNECOLOGY  VISIT   HPI: 63 y.o.   Divorced  Caucasian female   G1P0 with No LMP recorded. Patient is postmenopausal.   here for: Pessary check.  Using #5 ring with support.     Takes the pessary out once per month.     States she loves her pessary.   Notes some discharge.     No bleeding or pain.      GYNECOLOGIC HISTORY: No LMP recorded. Patient is postmenopausal. Contraception:  PMP Menopausal hormone therapy:  n/a Last 2 paps:  06/24/22 neg HPV neg, 06/10/20 neg  History of abnormal Pap or positive HPV:  yes 1992 Mammogram:  02/07/23 [facility] Cat 2 benign(care everywhere)         OB History     Gravida  1   Para      Term      Preterm      AB      Living  1      SAB      IAB      Ectopic      Multiple      Live Births  1              Patient Active Problem List   Diagnosis Date Noted   Migraine without status migrainosus, not intractable 12/14/2017    Past Medical History:  Diagnosis Date   Anxiety    Prolapse of female pelvic organs     Past Surgical History:  Procedure Laterality Date   Bartholin gland surgery     BREAST LUMPECTOMY Left 1995   benign   HYSTEROSCOPY  12/30/2014    Current Outpatient Medications  Medication Sig Dispense Refill   Calcium Carb-Cholecalciferol 600-5 MG-MCG TABS Take 1 tablet by mouth daily.     cetirizine (ZYRTEC) 10 MG tablet Take by mouth.     Cod Liver Oil 1000 MG CAPS      cycloSPORINE (RESTASIS) 0.05 % ophthalmic emulsion SMARTSIG:In Eye(s)     fluticasone (FLONASE) 50 MCG/ACT nasal spray      Multiple Vitamin (MULTIVITAMIN) capsule Take 1 capsule by mouth daily.     Oral Electrolytes (EMERGEN-C ELECTRO MIX) PACK      SUMAtriptan (IMITREX) 100 MG tablet Take 100 mg by mouth as directed.     venlafaxine XR (EFFEXOR-XR) 150 MG 24 hr capsule Take 150 mg by mouth daily.     No current facility-administered medications for this visit.     ALLERGIES: Patient has no known allergies.  Family  History  Problem Relation Age of Onset   Kidney failure Father        middle stages   Heart Problems Father     Social History   Socioeconomic History   Marital status: Divorced    Spouse name: Not on file   Number of children: Not on file   Years of education: Not on file   Highest education level: Not on file  Occupational History   Not on file  Tobacco Use   Smoking status: Never   Smokeless tobacco: Never  Substance and Sexual Activity   Alcohol use: Never   Drug use: Never   Sexual activity: Yes    Birth control/protection: Post-menopausal  Other Topics Concern   Not on file  Social History Narrative   Not on file   Social Drivers of Health   Financial Resource Strain: Low Risk  (01/08/2024)   Received from [facility]   Overall Financial Resource Strain (  CARDIA)    How hard is it for you to pay for the very basics like food, housing, medical care, and heating?: Not hard at all  Food Insecurity: No Food Insecurity (01/08/2024)   Received from [facility]   Hunger Vital Sign    Within the past 12 months, you worried that your food would run out before you got the money to buy more.: Never true    Within the past 12 months, the food you bought just didn't last and you didn't have money to get more.: Never true  Transportation Needs: No Transportation Needs (01/08/2024)   Received from [facility] - Transportation    In the past 12 months, has lack of transportation kept you from medical appointments or from getting medications?: No    In the past 12 months, has lack of transportation kept you from meetings, work, or from getting things needed for daily living?: No  Physical Activity: Insufficiently Active (01/08/2024)   Received from [facility]   Exercise Vital Sign    On average, how many days per week do you engage in moderate to strenuous exercise (like a brisk walk)?: 5 days    On average, how many minutes do you engage in exercise at this  level?: 20 min  Stress: No Stress Concern Present (01/08/2024)   Received from [facility] - Occupational Stress Questionnaire    Do you feel stress - tense, restless, nervous, or anxious, or unable to sleep at night because your mind is troubled all the time - these days?: Only a little  Social Connections: Moderately Integrated (01/08/2024)   Received from [facility]   Social Network    How would you rate your social network (family, work, friends)?: Adequate participation with social networks  Intimate Partner Violence: Patient Declined (01/08/2024)   Received from [facility]   HITS    Over the last 12 months how often did your partner physically hurt you?: Patient declined    Over the last 12 months how often did your partner insult you or talk down to you?: Patient declined    Over the last 12 months how often did your partner threaten you with physical harm?: Patient declined    Over the last 12 months how often did your partner scream or curse at you?: Patient declined    Review of Systems  All other systems reviewed and are negative.   PHYSICAL EXAMINATION:   BP 126/84 (BP Location: Right Arm, Patient Position: Sitting)   Pulse 96   SpO2 98%     General appearance: alert, cooperative and appears stated age   Pelvic: External genitalia:  no lesions              Urethra:  normal appearing urethra with no masses, tenderness or lesions              Bartholins and Skenes: normal                 Vagina: minor erythema of the posterior vaginal cuff              Cervix: no lesions                Bimanual Exam:  Uterus:  normal size, contour, position, consistency, mobility, non-tender              Adnexa: no mass, fullness, tenderness    Pessary removed, cleansed and replaced.  Chaperone was present for exam:  Clotilda DEL, CMA  ASSESSMENT:  Incomplete uterovaginal prolapse. Pessary management.  Doing well.  Inflammation  from pessary use.  Initial encounter.   PLAN:  Continue pessary care.  Will add vaginal estradiol cream 1 gram pv on pessary twice weekly.  I dicussed potential effect on a breast cancer.  FU in 3 months.  Annual exam in 6 months.

## 2024-05-08 ENCOUNTER — Ambulatory Visit: Admitting: Obstetrics and Gynecology

## 2024-08-01 ENCOUNTER — Ambulatory Visit: Admitting: Obstetrics and Gynecology
